# Patient Record
Sex: Female | Born: 1935 | Race: White | Hispanic: No
Health system: Southern US, Community
[De-identification: ages and names within clinical notes are randomized; demographics above are authoritative.]

---

## 2004-06-27 ENCOUNTER — Emergency Department: Payer: Self-pay | Admitting: Emergency Medicine

## 2005-05-06 ENCOUNTER — Ambulatory Visit: Payer: Self-pay | Admitting: Internal Medicine

## 2006-05-11 ENCOUNTER — Ambulatory Visit: Payer: Self-pay | Admitting: Internal Medicine

## 2008-01-17 ENCOUNTER — Emergency Department: Payer: Self-pay | Admitting: Internal Medicine

## 2008-01-17 ENCOUNTER — Other Ambulatory Visit: Payer: Self-pay

## 2008-08-19 ENCOUNTER — Emergency Department: Payer: Self-pay | Admitting: Emergency Medicine

## 2011-07-27 ENCOUNTER — Emergency Department: Payer: Self-pay | Admitting: Emergency Medicine

## 2012-06-24 ENCOUNTER — Observation Stay: Payer: Self-pay | Admitting: Internal Medicine

## 2012-06-24 LAB — COMPREHENSIVE METABOLIC PANEL
Albumin: 3.6 g/dL (ref 3.4–5.0)
Anion Gap: 9 (ref 7–16)
BUN: 20 mg/dL — ABNORMAL HIGH (ref 7–18)
Glucose: 105 mg/dL — ABNORMAL HIGH (ref 65–99)
Osmolality: 282 (ref 275–301)
Potassium: 4.1 mmol/L (ref 3.5–5.1)
Sodium: 140 mmol/L (ref 136–145)
Total Protein: 7.1 g/dL (ref 6.4–8.2)

## 2012-06-24 LAB — TSH: Thyroid Stimulating Horm: 1.69 u[IU]/mL

## 2012-06-24 LAB — CK TOTAL AND CKMB (NOT AT ARMC)
CK, Total: 95 U/L (ref 21–215)
CK, Total: 110 U/L (ref 21–215)
CK-MB: 3.3 ng/mL (ref 0.5–3.6)

## 2012-06-24 LAB — PROTIME-INR: INR: 0.9

## 2012-06-24 LAB — URINALYSIS, COMPLETE
Bacteria: NONE SEEN
Bilirubin,UR: NEGATIVE
Glucose,UR: NEGATIVE mg/dL (ref 0–75)
Leukocyte Esterase: NEGATIVE
Nitrite: NEGATIVE
Protein: NEGATIVE
Specific Gravity: 1.005 (ref 1.003–1.030)
Squamous Epithelial: <1
WBC UR: <1 /HPF (ref 0–5)

## 2012-06-24 LAB — CBC
HCT: 38.8 % (ref 35.0–47.0)
HGB: 13 g/dL (ref 12.0–16.0)
MCH: 29.6 pg (ref 26.0–34.0)
MCHC: 33.5 g/dL (ref 32.0–36.0)
MCV: 88 fL (ref 80–100)
RBC: 4.39 10*6/uL (ref 3.80–5.20)
RDW: 13.3 % (ref 11.5–14.5)

## 2012-06-24 LAB — TROPONIN I
Troponin-I: <0.02 ng/mL
Troponin-I: <0.02 ng/mL

## 2012-06-25 LAB — LIPID PANEL
HDL Cholesterol: 53 mg/dL (ref 40–60)
Triglycerides: 54 mg/dL (ref 0–200)

## 2012-06-25 LAB — CK TOTAL AND CKMB (NOT AT ARMC): CK-MB: 5.5 ng/mL — ABNORMAL HIGH (ref 0.5–3.6)

## 2012-06-25 LAB — TROPONIN I: Troponin-I: <0.02 ng/mL

## 2012-06-26 LAB — BASIC METABOLIC PANEL
Anion Gap: 4 — ABNORMAL LOW (ref 7–16)
Calcium, Total: 8.6 mg/dL (ref 8.5–10.1)
Chloride: 104 mmol/L (ref 98–107)
Co2: 32 mmol/L (ref 21–32)
Creatinine: 0.9 mg/dL (ref 0.60–1.30)
Glucose: 87 mg/dL (ref 65–99)
Potassium: 3.9 mmol/L (ref 3.5–5.1)
Sodium: 140 mmol/L (ref 136–145)

## 2014-03-02 ENCOUNTER — Emergency Department: Payer: Self-pay | Admitting: Emergency Medicine

## 2014-03-02 LAB — CBC WITH DIFFERENTIAL/PLATELET
BASOS ABS: 0 10*3/uL (ref 0.0–0.1)
BASOS PCT: 0.6 %
EOS ABS: 0 10*3/uL (ref 0.0–0.7)
EOS PCT: 0.7 %
HCT: 40.4 % (ref 35.0–47.0)
HGB: 12.9 g/dL (ref 12.0–16.0)
LYMPHS ABS: 1.3 10*3/uL (ref 1.0–3.6)
LYMPHS PCT: 29.9 %
MCH: 28.9 pg (ref 26.0–34.0)
MCHC: 32 g/dL (ref 32.0–36.0)
MCV: 90 fL (ref 80–100)
Monocyte #: 0.4 x10 3/mm (ref 0.2–0.9)
Monocyte %: 8.7 %
NEUTROS PCT: 60.1 %
Neutrophil #: 2.7 10*3/uL (ref 1.4–6.5)
PLATELETS: 291 10*3/uL (ref 150–440)
RBC: 4.48 10*6/uL (ref 3.80–5.20)
RDW: 14.6 % — ABNORMAL HIGH (ref 11.5–14.5)
WBC: 4.5 10*3/uL (ref 3.6–11.0)

## 2014-03-02 LAB — URINALYSIS, COMPLETE
BACTERIA: NONE SEEN
Bilirubin,UR: NEGATIVE
Blood: NEGATIVE
Glucose,UR: NEGATIVE mg/dL (ref 0–75)
Ketone: NEGATIVE
Leukocyte Esterase: NEGATIVE
Nitrite: NEGATIVE
Ph: 7 (ref 4.5–8.0)
Protein: NEGATIVE
RBC,UR: <1 /HPF (ref 0–5)
Specific Gravity: 1.003 (ref 1.003–1.030)
Squamous Epithelial: <1

## 2014-03-02 LAB — COMPREHENSIVE METABOLIC PANEL
ALK PHOS: 78 U/L
ANION GAP: 8 (ref 7–16)
Albumin: 3.6 g/dL (ref 3.4–5.0)
BUN: 12 mg/dL (ref 7–18)
Bilirubin,Total: 0.3 mg/dL (ref 0.2–1.0)
CALCIUM: 8.9 mg/dL (ref 8.5–10.1)
Chloride: 102 mmol/L (ref 98–107)
Co2: 28 mmol/L (ref 21–32)
Creatinine: 0.77 mg/dL (ref 0.60–1.30)
EGFR (Non-African Amer.): >60
GLUCOSE: 136 mg/dL — AB (ref 65–99)
Osmolality: 278 (ref 275–301)
POTASSIUM: 3.2 mmol/L — AB (ref 3.5–5.1)
SGOT(AST): 15 U/L (ref 15–37)
SGPT (ALT): 14 U/L (ref 12–78)
Sodium: 138 mmol/L (ref 136–145)
Total Protein: 7.4 g/dL (ref 6.4–8.2)

## 2014-03-02 LAB — TROPONIN I

## 2014-12-10 NOTE — H&P (Signed)
PATIENT NAME:  Maria Montes, Maria Montes MR#:  [id number] DATE OF BIRTH:  31-May-1921  DATE OF ADMISSION:  06/24/2012  CHIEF COMPLAINT: Altered mental status.   HISTORY OF PRESENT ILLNESS: Maria Montes is an 81 year old white female with a history of hypertension who was brought to the Emergency Room this morning after having mental status changes. The family notes that the patient called 911 at 4 o'clock  this morning stating that her husband was not home and she had just gotten home from work. The patient has been retired for years and her husband has been dead for over eight years. The family was called to the house and calmed her down and then returned several hours later this morning and found her still disoriented. She did not know whose house she was living in and she did not recognize her children. She has been in this house for over 50 years. She was brought to the Emergency Room for further evaluation. Per family, they have noted decreased appetite over the last several months without any true weight loss. The family does bring her a prepared meal every day and they notice she has not been finishing it at times and saving some for later. She has had increased appetite for sweet things and eats a lot of oatmeal cookies and peppermints all day long. They have also noticed some increased bowel incontinence over the last several months with some evidence of diarrhea. However, she has denied abdominal pain and any other symptoms and has refused medical evaluation, except for her annual eat yearly with Dr. Dan Humphreys, her regular doctor.  The patient has also noted that despite putting her medications out in a pill box, she forgets to take them often.  In the ER, she was noted to be quite hypertensive with an initial blood pressure of 192/87, on repeat 182/112. She is being admitted for management of hypertensive urgency with delirium.   The patient's primary care physician is Dr. Yates Decamp, at [facility], last seen  several months ago. She was also seen in the ER about one month ago for confusion.   PAST MEDICAL HISTORY: Hypertension.   CURRENT MEDICATIONS:  1. Potassium chloride 20 mg twice daily.  2. Lopressor 25 mg twice daily.  3. Amlodipine 5 mg daily.   PAST SURGICAL HISTORY: She has no past surgical history.   DRUG ALLERGIES: No known drug allergies.   HOSPITALIZATIONS: She has never been hospitalized.   FAMILY HISTORY: Unremarkable.   SOCIAL HISTORY: She lives alone. She is a lifelong nonsmoker and nondrinker. She is currently independent of all daily functions. The family does look in on her at least once a day as several children live close by.   REVIEW OF SYSTEMS: The patient denies fever, fatigue, weakness, pain, and weight changes. She denies any changes in her vision. She denies any history of ear pain, hearing loss, or seasonal rhinitis. She denies difficulty swallowing. She denies any cough, dyspnea, chest pain, or hemoptysis. She denies lower extremity edema, dyspnea on exertion, and syncope. She is unaware that she has a history of hypertension. She denies any nausea, vomiting, diarrhea, or abdominal pain. She states that her last bowel movement was yesterday. She denies any dysuria or incontinence. She has no history of polyuria, nocturia, or thyroid problems. She denies anemia or any recent bleeding or bruising. She denies any chronic neck, back, shoulder, knee or hip pain. She denies any history of tremors, vertigo, or ataxia. She has no history of strokes, migraines,  or seizures. She denies any history of anxiety or insomnia.   PHYSICAL EXAMINATION:   GENERAL: This is a very healthy-appearing elderly female in no apparent distress.  VITALS: Current blood pressure pretreatment 189/84, pulse is regular at 92, respirations 20, temperature 97.9, and she is saturating 97% on room air.   HEENT: Pupils are equal, round, and reactive to light. Extraocular movements are intact. Sclerae  are anicteric. Oropharynx is benign.   NECK: Supple without lymphadenopathy, JVD, thyromegaly, or carotid bruits. There was no pain with palpation or manipulation.   PULMONARY: Lungs are clear to auscultation bilaterally. No wheezes or rhonchi.   CARDIOVASCULAR: Regular rate and rhythm. No murmurs, rubs, or gallops. Chest wall is nontender. There is no lower extremity edema. Pedal pulses are palpable.   BREASTS: Nontender with no masses.   ABDOMEN: Soft, nontender, and nondistended with good bowel sounds and no evidence of hepatosplenomegaly.   GYN: Deferred.   MUSCULOSKELETAL: She has five out of five musculoskeletal strength in all extremities. Gait was not tested.   SKIN: Skin is warm and dry without rashes or lesions. She does have a fleshy, pedunculated tumor on her back which is new according to family. There is no cervical, axillary, inguinal, or supraclavicular lymphadenopathy.   NEUROLOGICAL: Examination is grossly nonfocal. She has intact cranial nerves. Deep tendon reflexes are symmetric. Sensation is intact to light touch. She has no evidence of dysarthria or aphasia.   PSYCHIATRIC: She is alert. She is oriented to place only. She was not oriented to date or president. She does not appear agitated.   ADMISSION LABORATORY, DIAGNOSTIC AND RADIOLOGIC DATA: Sodium 140, potassium 4.1, chloride 104, bicarbonate 27, BUN 20, creatinine 0.84, and glucose 105. White count 5.8, hemoglobin 13.0, and platelets 258.   Urinalysis was completely normal. No protein and specific gravity is 1.005.   TSH was normal at 1.69 CK, MB and troponin were 95, 3.0 and less than 0.02, respectively.   EKG shows normal sinus rhythm with borderline left atrial enlargement.   CT of the head shows nothing acute. There are atrophic changes and microvascular ischemic changes noted diffusely.   Chest x-ray shows clear lung fields, mildly hyperinflated. She has borderline cardiomegaly.    ASSESSMENT AND  PLAN:  1. Delirium superimposed on progressive cognitive deficits, per family. Precipitating factor unclear. It may be due to hypertensive crisis. We will admit under observation status, control hypertension, and obtain B12 level and MRI to rule out thalamic infarct.  2. Hypertensive crisis. The patient is hypertensive in the ED today. She did not take her medications this morning. She was given IV labetalol during my evaluation and repeat blood pressure is pending. We will admit her to telemetry and give her usual doses of metoprolol and amlodipine and use p.r.n. labetalol to control blood pressure.  3. Disposition. The patient is FULL CODE, per family, in conversation with the son who has POA. I have discussed long-term care with them and they are currently planning on her returning to home with family members providing round-the-clock care for her. We have also discussed assisted living facilities and the son has already evaluated [facility]. He recognizes that if her baseline mental status changes do not improve that she may be looking at need for assisted living rather than home.   ESTIMATED TIME OF CARE: 45 minutes.  ____________________________ Duncan Dulleresa Tersa Fotopoulos, MD tt:slb D: 06/24/2012 15:31:16 ET T: 06/25/2012 08:49:21 ET JOB#: [id number]  cc: Duncan Dulleresa Earsie Humm, MD, <Dictator> Emmagrace Runkel  Angeni Chaudhuri MD ELECTRONICALLY SIGNED 07/06/2012 16:01

## 2015-08-24 DEATH — deceased

## 2016-07-08 IMAGING — US US EXTREM LOW VENOUS*R*
1 series · 13 of 24 positions shown · non-contrast
Comparison: None.

CLINICAL DATA: Right lower extremity edema.



[Series 1: us extrem low venous*right* · 0.07mm/px · 13 of 32 slices shown]
[im 1/32]
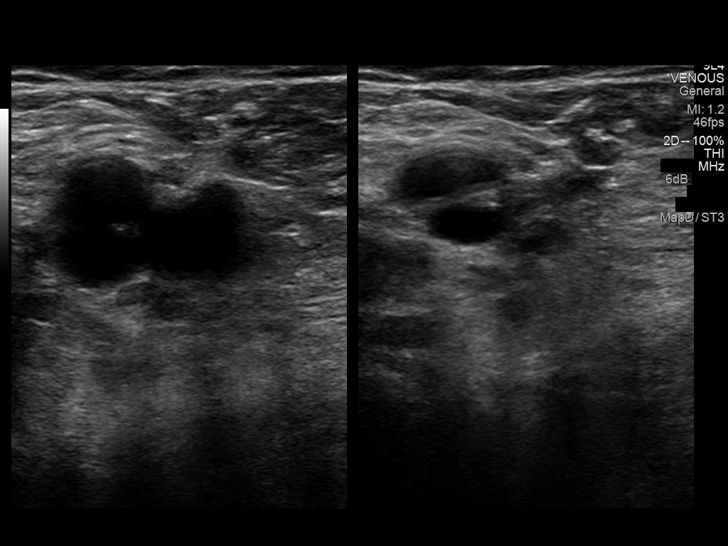
[im 3/32]
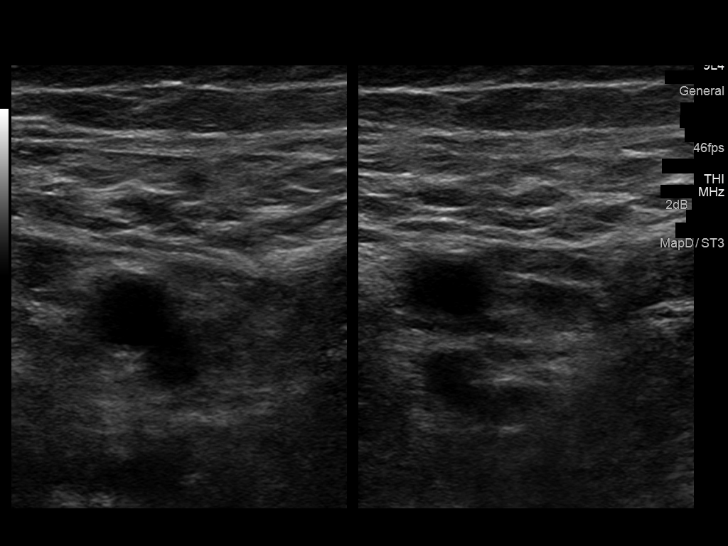
[im 6/32]
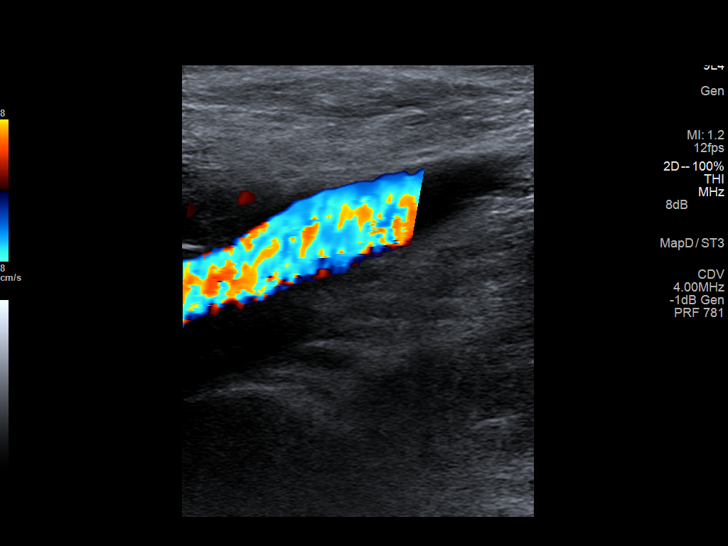
[im 9/32]
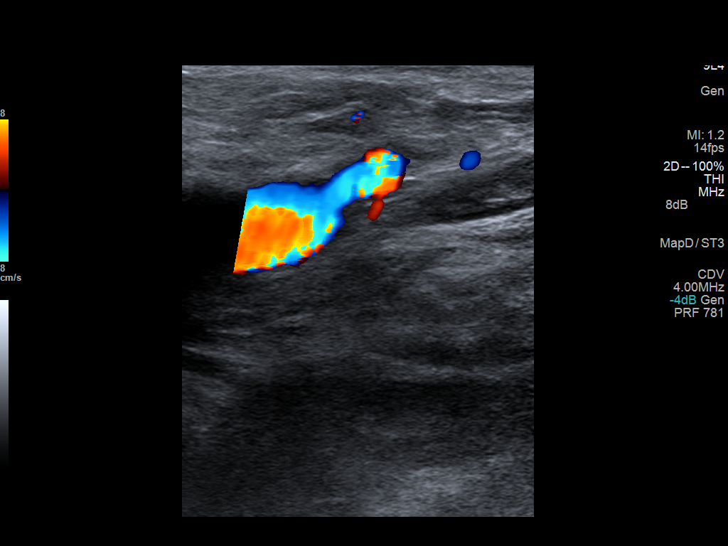
[im 11/32]
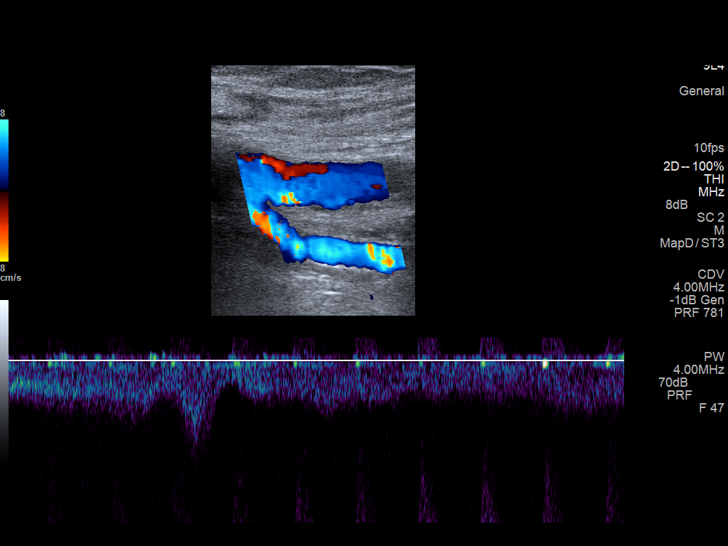
[im 14/32]
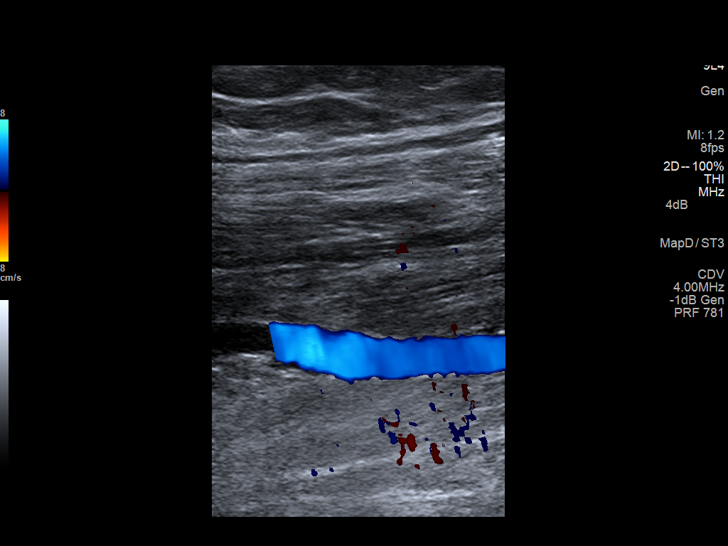
[im 17/32]
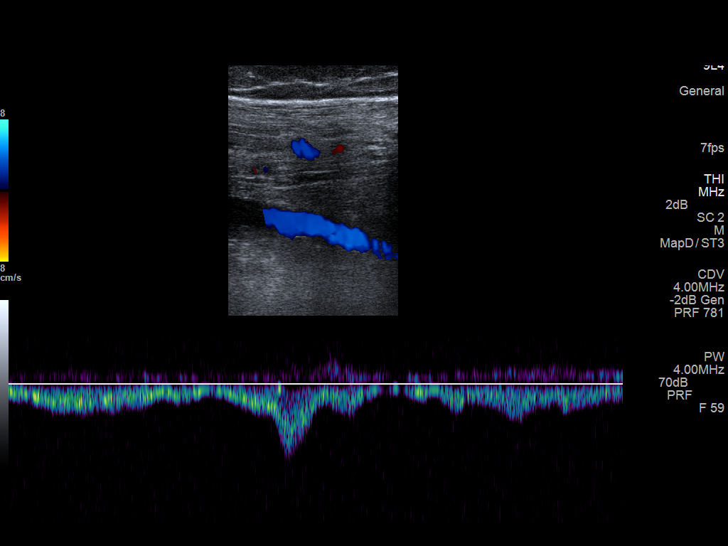
[im 18/32]
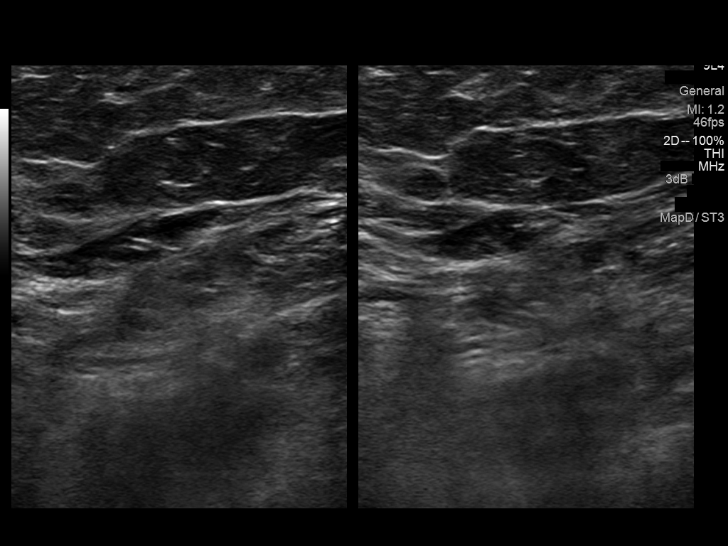
[im 21/32]
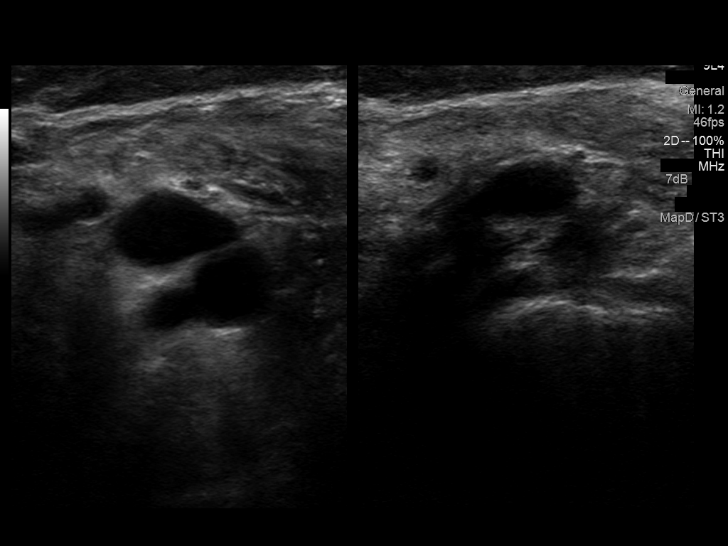
[im 23/32]
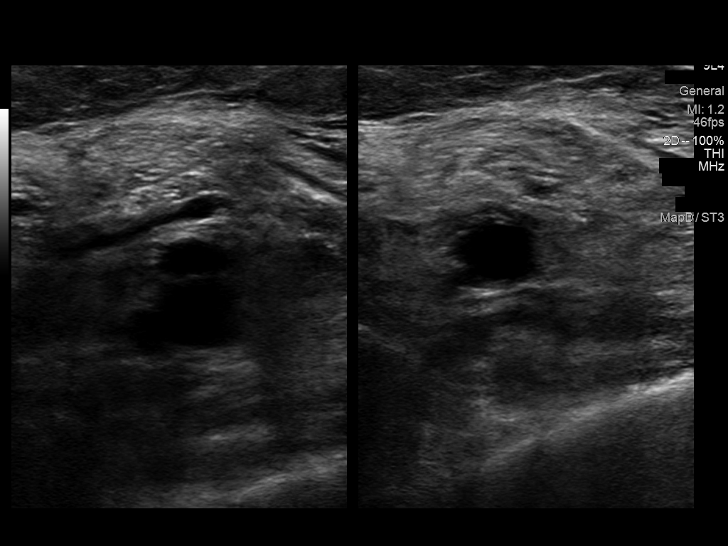
[im 26/32]
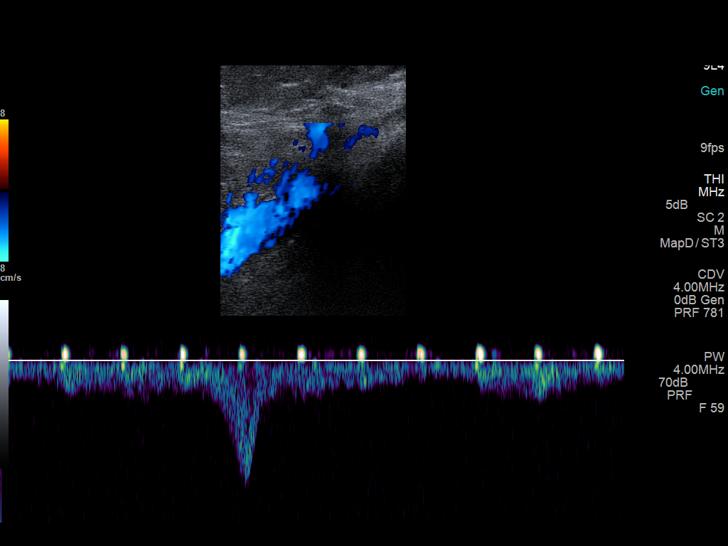
[im 29/32]
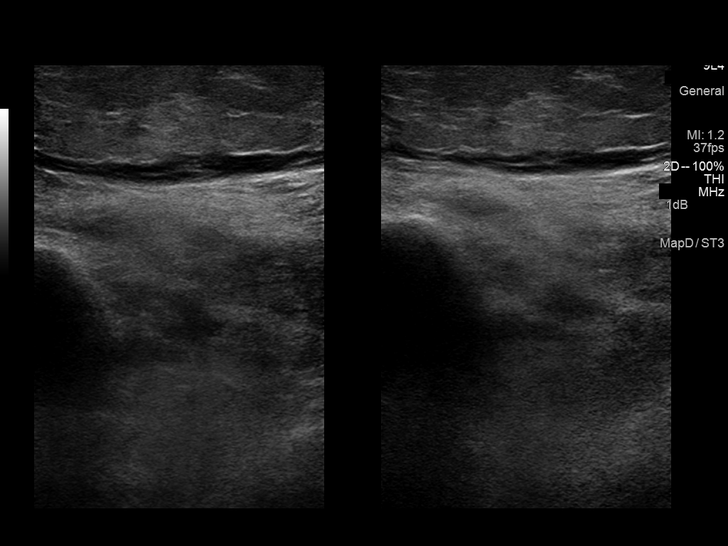
[im 32/32]
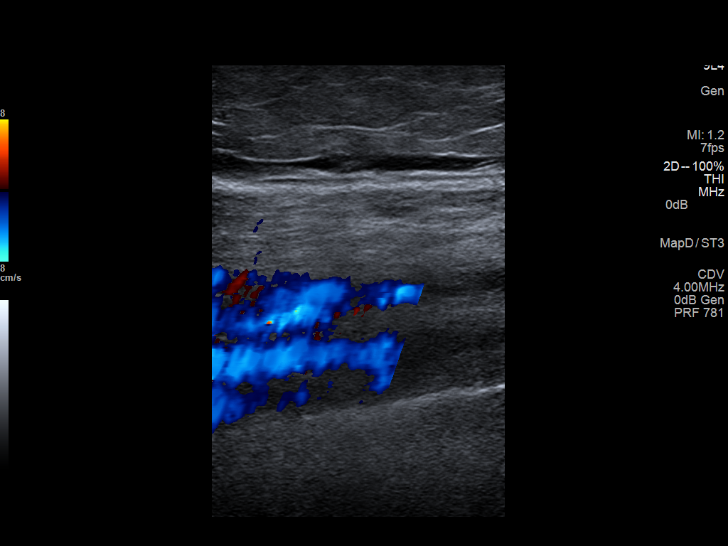

[13 of 24 positions shown; findings below may reference images not displayed]

FINDINGS: Common Femoral Vein: No evidence of thrombus. Normal
compressibility, respiratory phasicity and response to augmentation.

Saphenofemoral Junction: No evidence of thrombus. Normal
compressibility and flow on color Doppler imaging.

Profunda Femoral Vein: No evidence of thrombus. Normal
compressibility and flow on color Doppler imaging.

Femoral Vein: No evidence of thrombus. Normal compressibility,
respiratory phasicity and response to augmentation.

Popliteal Vein: No evidence of thrombus. Normal compressibility,
respiratory phasicity and response to augmentation.

Calf Veins: No evidence of thrombus. Normal compressibility and flow
on color Doppler imaging.

Superficial Great Saphenous Vein: No evidence of thrombus. Normal
compressibility and flow on color Doppler imaging.
IMPRESSION: No evidence of deep venous thrombosis.

## 2016-07-08 IMAGING — CT CT HEAD WITHOUT CONTRAST
1 series · 16 of 30 positions shown, 20 images · non-contrast
Comparison: PET-CT 06/24/2012, brain MRI 06/26/2012

CLINICAL DATA: Altered mental status

EXAM:
CT HEAD WITHOUT CONTRAST
TECHNIQUE: Contiguous axial images were obtained from the base of the skull
through the vertex without intravenous contrast.

[Series 2: head wo · axial · 0.42mm/px · z∈[-28,+101]mm · 16 of 32 slices shown, 20 images]
[im 2/32  brain]
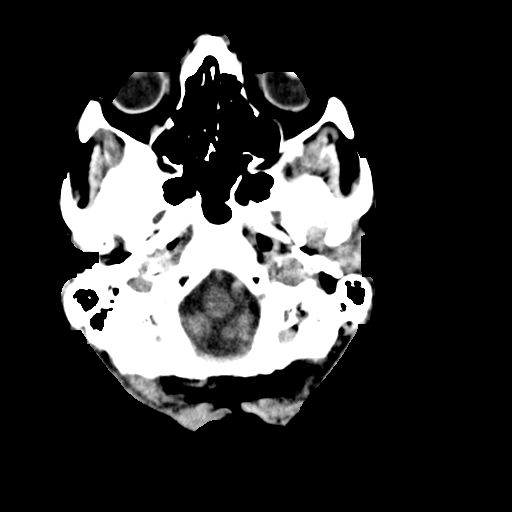
[im 2/32  bone]
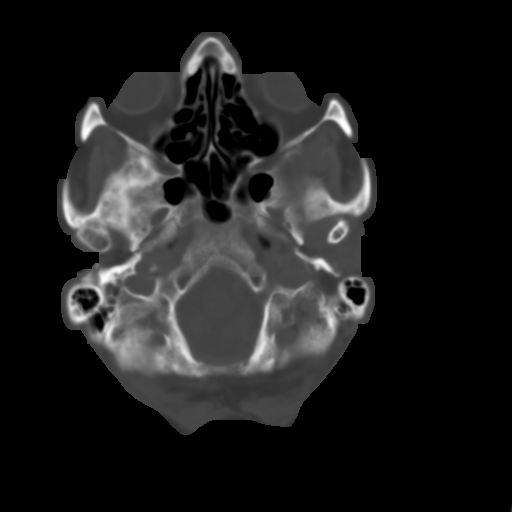
[im 4/32  brain]
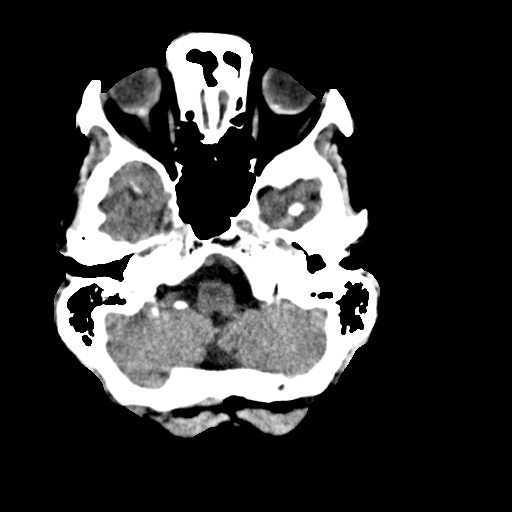
[im 6/32  brain]
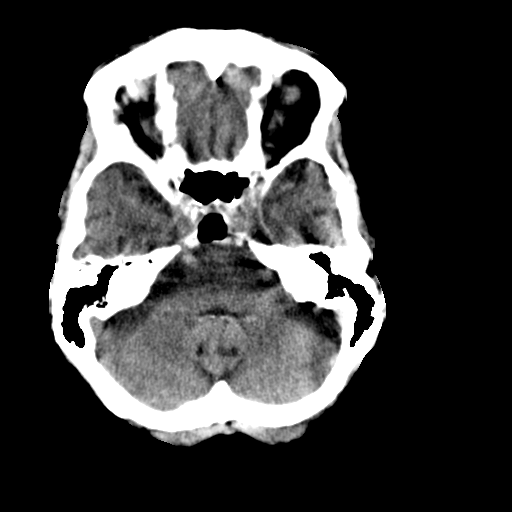
[im 8/32  brain]
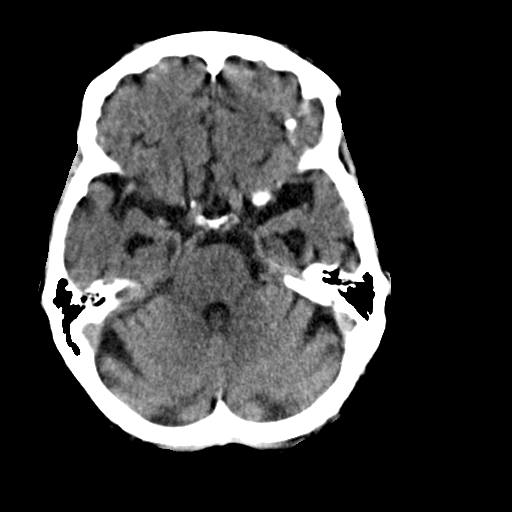
[im 9/32  brain]
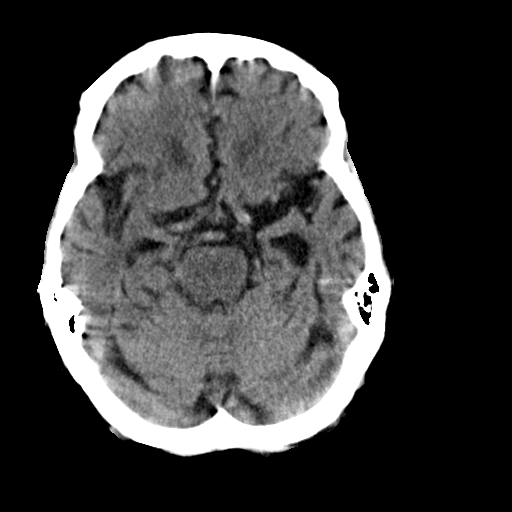
[im 9/32  bone]
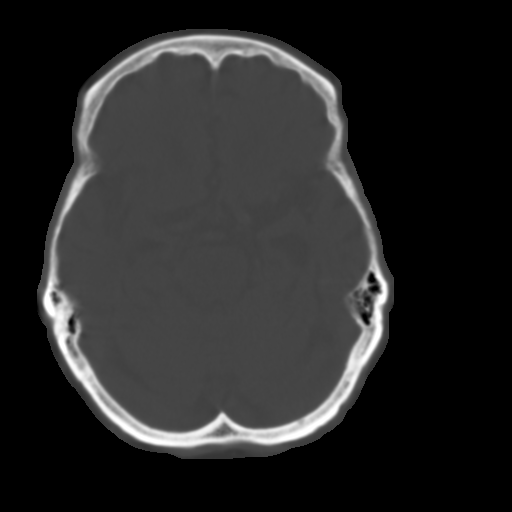
[im 11/32  brain]
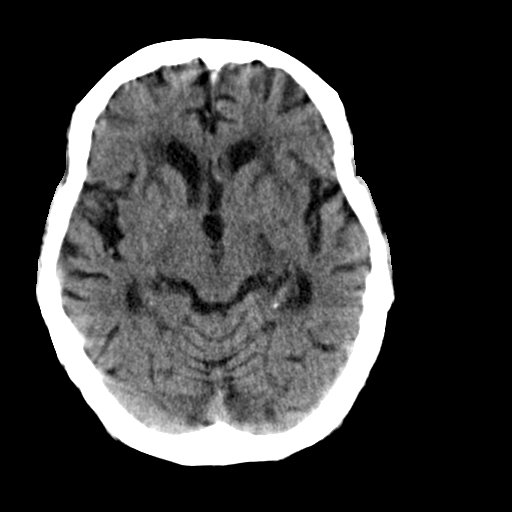
[im 13/32  brain]
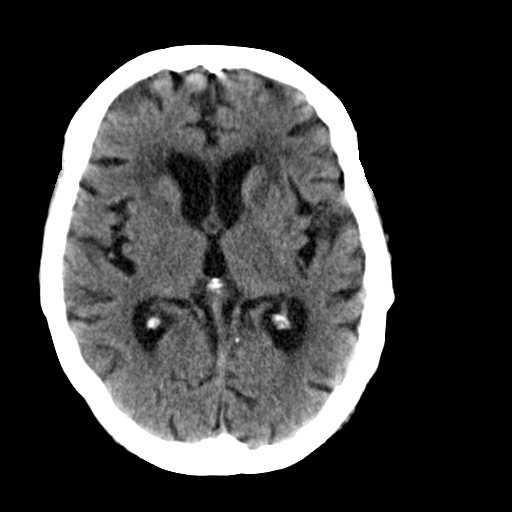
[im 15/32  brain]
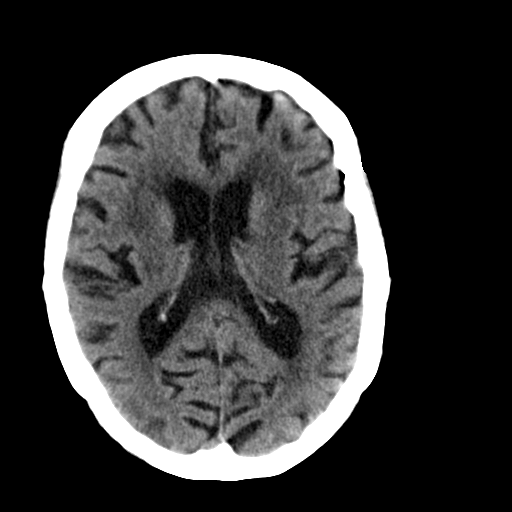
[im 17/32  brain]
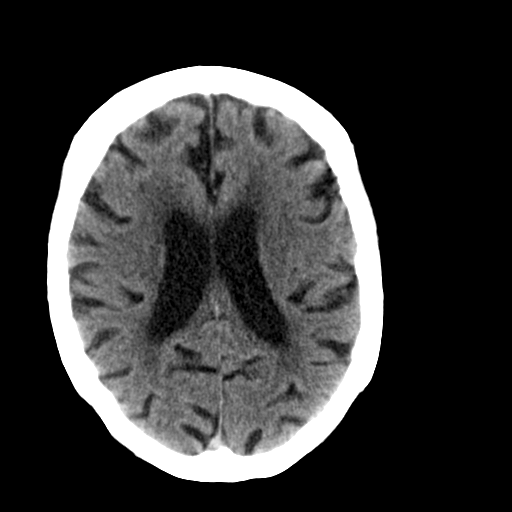
[im 17/32  bone]
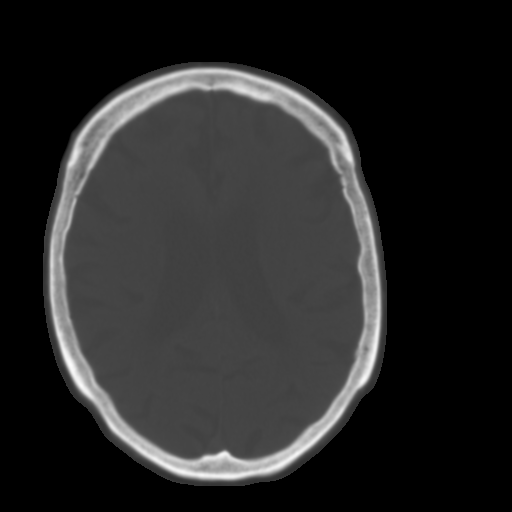
[im 19/32  brain]
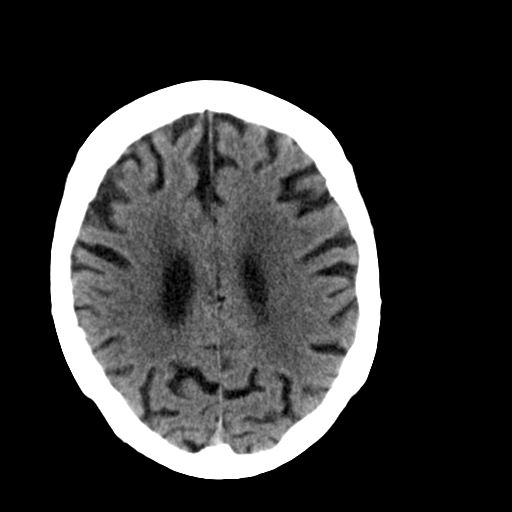
[im 21/32  brain]
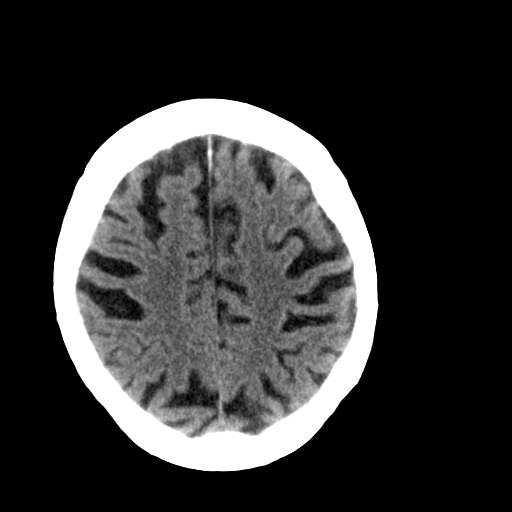
[im 23/32  brain]
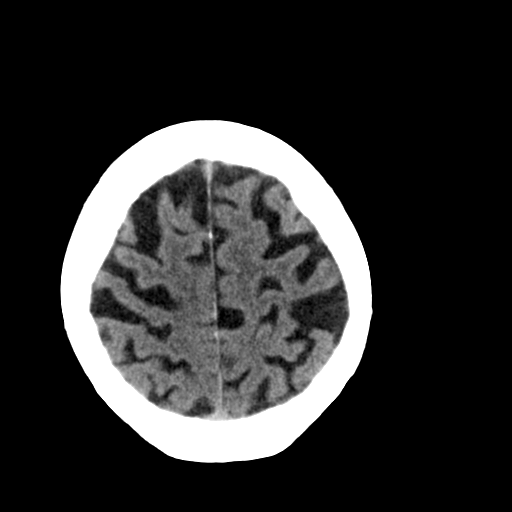
[im 24/32  brain]
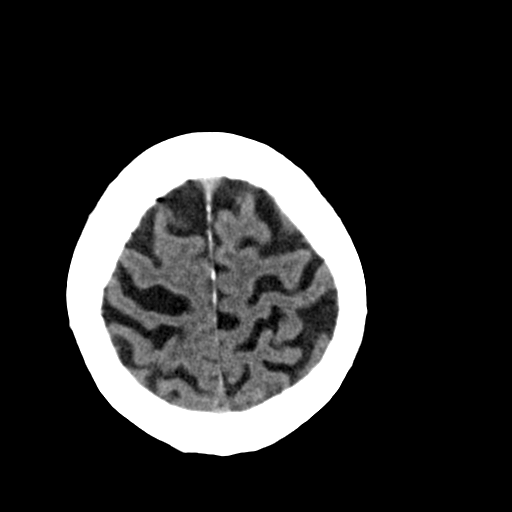
[im 24/32  bone]
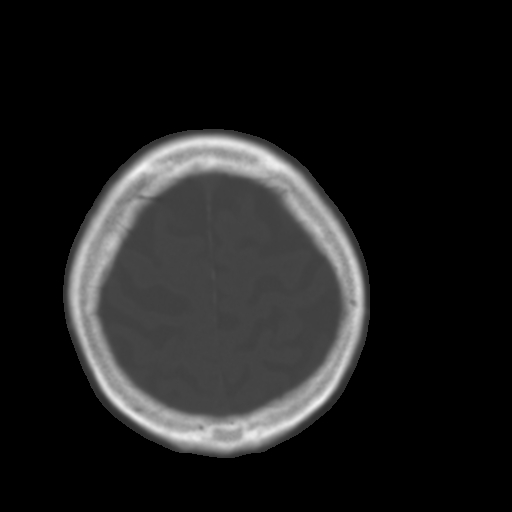
[im 26/32  brain]
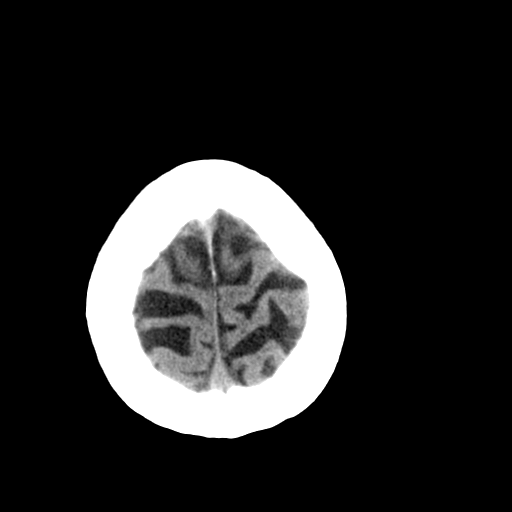
[im 28/32  brain]
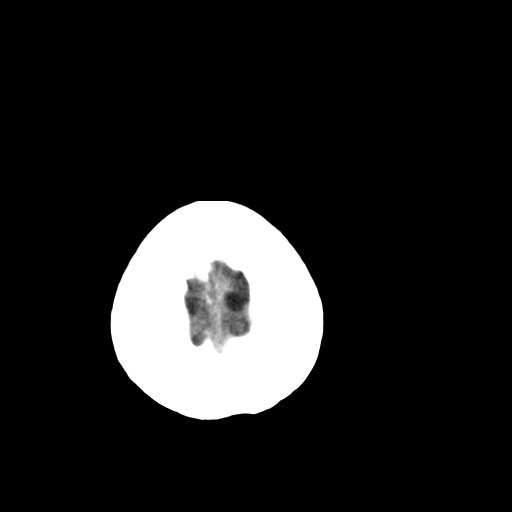
[im 30/32  brain]
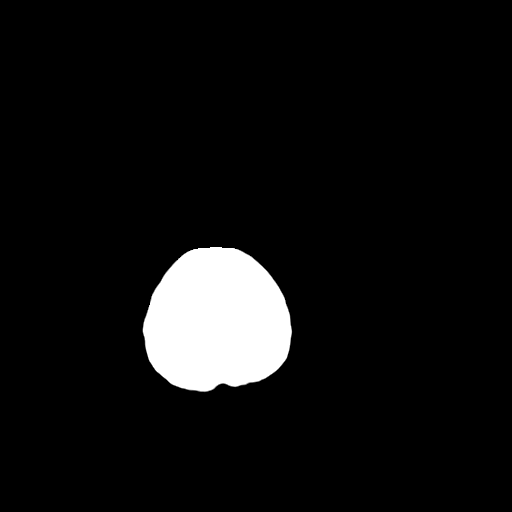

[16 of 30 positions shown; findings below may reference images not displayed]

FINDINGS: No acute intracranial hemorrhage. No focal mass lesion. No CT
evidence of acute infarction. No midline shift or mass effect. No
hydrocephalus. Basilar cisterns are patent.

There is extensive periventricular and subcortical white matter
hypodensities. There is extensive cortical atrophy. These findings
are unchanged from comparison exams.

Paranasal sinuses and  mastoid air cells are clear.
IMPRESSION: 1. No acute intracranial findings.
2. Atrophy and severe white matter microvascular disease are
unchanged from comparison exams.
